# Patient Record
Sex: Female | Born: 1985 | Race: Black or African American | Hispanic: No | Marital: Married | Smoking: Never smoker
Health system: Southern US, Community
[De-identification: ages and names within clinical notes are randomized; demographics above are authoritative.]

## PROBLEM LIST (undated history)

## (undated) DIAGNOSIS — D6861 Antiphospholipid syndrome: Secondary | ICD-10-CM

## (undated) DIAGNOSIS — I10 Essential (primary) hypertension: Secondary | ICD-10-CM

## (undated) DIAGNOSIS — I509 Heart failure, unspecified: Secondary | ICD-10-CM

## (undated) DIAGNOSIS — IMO0002 Reserved for concepts with insufficient information to code with codable children: Secondary | ICD-10-CM

## (undated) DIAGNOSIS — I639 Cerebral infarction, unspecified: Secondary | ICD-10-CM

## (undated) DIAGNOSIS — C8590 Non-Hodgkin lymphoma, unspecified, unspecified site: Secondary | ICD-10-CM

## (undated) DIAGNOSIS — M329 Systemic lupus erythematosus, unspecified: Secondary | ICD-10-CM

## (undated) DIAGNOSIS — I219 Acute myocardial infarction, unspecified: Secondary | ICD-10-CM

## (undated) DIAGNOSIS — D571 Sickle-cell disease without crisis: Secondary | ICD-10-CM

## (undated) DIAGNOSIS — I675 Moyamoya disease: Secondary | ICD-10-CM

## (undated) HISTORY — PX: SPLENECTOMY: SUR1306

## (undated) HISTORY — PX: CHOLECYSTECTOMY: SHX55

---

## 2019-12-11 DIAGNOSIS — S31119A Laceration without foreign body of abdominal wall, unspecified quadrant without penetration into peritoneal cavity, initial encounter: Secondary | ICD-10-CM

## 2019-12-11 HISTORY — DX: Laceration without foreign body of abdominal wall, unspecified quadrant without penetration into peritoneal cavity, initial encounter: S31.119A

## 2019-12-13 ENCOUNTER — Emergency Department (HOSPITAL_COMMUNITY): Payer: Self-pay

## 2019-12-13 ENCOUNTER — Inpatient Hospital Stay (HOSPITAL_COMMUNITY)
Admission: EM | Admit: 2019-12-13 | Discharge: 2019-12-17 | DRG: 812 | Disposition: A | Discharge status: Home / Self Care | Payer: Self-pay | Attending: Internal Medicine | Admitting: Internal Medicine

## 2019-12-13 ENCOUNTER — Other Ambulatory Visit: Payer: Self-pay

## 2019-12-13 ENCOUNTER — Encounter (HOSPITAL_COMMUNITY): Payer: Self-pay | Admitting: Emergency Medicine

## 2019-12-13 DIAGNOSIS — I252 Old myocardial infarction: Secondary | ICD-10-CM

## 2019-12-13 DIAGNOSIS — Z8673 Personal history of transient ischemic attack (TIA), and cerebral infarction without residual deficits: Secondary | ICD-10-CM

## 2019-12-13 DIAGNOSIS — F3130 Bipolar disorder, current episode depressed, mild or moderate severity, unspecified: Secondary | ICD-10-CM | POA: Diagnosis present

## 2019-12-13 DIAGNOSIS — S31119A Laceration without foreign body of abdominal wall, unspecified quadrant without penetration into peritoneal cavity, initial encounter: Secondary | ICD-10-CM

## 2019-12-13 DIAGNOSIS — Z87892 Personal history of anaphylaxis: Secondary | ICD-10-CM

## 2019-12-13 DIAGNOSIS — Z79899 Other long term (current) drug therapy: Secondary | ICD-10-CM

## 2019-12-13 DIAGNOSIS — D571 Sickle-cell disease without crisis: Secondary | ICD-10-CM

## 2019-12-13 DIAGNOSIS — R29898 Other symptoms and signs involving the musculoskeletal system: Secondary | ICD-10-CM

## 2019-12-13 DIAGNOSIS — S31114A Laceration without foreign body of abdominal wall, left lower quadrant without penetration into peritoneal cavity, initial encounter: Secondary | ICD-10-CM | POA: Diagnosis present

## 2019-12-13 DIAGNOSIS — Z86711 Personal history of pulmonary embolism: Secondary | ICD-10-CM

## 2019-12-13 DIAGNOSIS — I675 Moyamoya disease: Secondary | ICD-10-CM | POA: Diagnosis present

## 2019-12-13 DIAGNOSIS — R531 Weakness: Secondary | ICD-10-CM | POA: Diagnosis present

## 2019-12-13 DIAGNOSIS — Z8249 Family history of ischemic heart disease and other diseases of the circulatory system: Secondary | ICD-10-CM

## 2019-12-13 DIAGNOSIS — R079 Chest pain, unspecified: Secondary | ICD-10-CM

## 2019-12-13 DIAGNOSIS — D5700 Hb-SS disease with crisis, unspecified: Principal | ICD-10-CM | POA: Diagnosis present

## 2019-12-13 DIAGNOSIS — Z832 Family history of diseases of the blood and blood-forming organs and certain disorders involving the immune mechanism: Secondary | ICD-10-CM

## 2019-12-13 DIAGNOSIS — Z7982 Long term (current) use of aspirin: Secondary | ICD-10-CM

## 2019-12-13 DIAGNOSIS — I2510 Atherosclerotic heart disease of native coronary artery without angina pectoris: Secondary | ICD-10-CM

## 2019-12-13 DIAGNOSIS — Z7902 Long term (current) use of antithrombotics/antiplatelets: Secondary | ICD-10-CM

## 2019-12-13 DIAGNOSIS — Z20822 Contact with and (suspected) exposure to covid-19: Secondary | ICD-10-CM | POA: Diagnosis present

## 2019-12-13 DIAGNOSIS — Z91041 Radiographic dye allergy status: Secondary | ICD-10-CM

## 2019-12-13 DIAGNOSIS — C8590 Non-Hodgkin lymphoma, unspecified, unspecified site: Secondary | ICD-10-CM | POA: Diagnosis present

## 2019-12-13 DIAGNOSIS — Z7901 Long term (current) use of anticoagulants: Secondary | ICD-10-CM

## 2019-12-13 DIAGNOSIS — M329 Systemic lupus erythematosus, unspecified: Secondary | ICD-10-CM | POA: Diagnosis present

## 2019-12-13 DIAGNOSIS — D6861 Antiphospholipid syndrome: Secondary | ICD-10-CM | POA: Diagnosis present

## 2019-12-13 DIAGNOSIS — S31119D Laceration without foreign body of abdominal wall, unspecified quadrant without penetration into peritoneal cavity, subsequent encounter: Secondary | ICD-10-CM

## 2019-12-13 DIAGNOSIS — I110 Hypertensive heart disease with heart failure: Secondary | ICD-10-CM | POA: Diagnosis present

## 2019-12-13 DIAGNOSIS — G894 Chronic pain syndrome: Secondary | ICD-10-CM | POA: Diagnosis present

## 2019-12-13 DIAGNOSIS — Z955 Presence of coronary angioplasty implant and graft: Secondary | ICD-10-CM

## 2019-12-13 DIAGNOSIS — I509 Heart failure, unspecified: Secondary | ICD-10-CM | POA: Diagnosis present

## 2019-12-13 DIAGNOSIS — T148XXA Other injury of unspecified body region, initial encounter: Secondary | ICD-10-CM

## 2019-12-13 DIAGNOSIS — Z86718 Personal history of other venous thrombosis and embolism: Secondary | ICD-10-CM

## 2019-12-13 DIAGNOSIS — I639 Cerebral infarction, unspecified: Secondary | ICD-10-CM

## 2019-12-13 HISTORY — DX: Cerebral infarction, unspecified: I63.9

## 2019-12-13 HISTORY — DX: Systemic lupus erythematosus, unspecified: M32.9

## 2019-12-13 HISTORY — DX: Antiphospholipid syndrome: D68.61

## 2019-12-13 HISTORY — DX: Sickle-cell disease without crisis: D57.1

## 2019-12-13 HISTORY — DX: Non-Hodgkin lymphoma, unspecified, unspecified site: C85.90

## 2019-12-13 HISTORY — DX: Moyamoya disease: I67.5

## 2019-12-13 HISTORY — DX: Heart failure, unspecified: I50.9

## 2019-12-13 HISTORY — DX: Essential (primary) hypertension: I10

## 2019-12-13 HISTORY — DX: Reserved for concepts with insufficient information to code with codable children: IMO0002

## 2019-12-13 HISTORY — DX: Morbid (severe) obesity due to excess calories: E66.01

## 2019-12-13 HISTORY — DX: Acute myocardial infarction, unspecified: I21.9

## 2019-12-13 LAB — CBC
HCT: 29.5 % — ABNORMAL LOW (ref 36.0–46.0)
Hemoglobin: 8.8 g/dL — ABNORMAL LOW (ref 12.0–15.0)
MCH: 30.6 pg (ref 26.0–34.0)
MCHC: 29.8 g/dL — ABNORMAL LOW (ref 30.0–36.0)
MCV: 102.4 fL — ABNORMAL HIGH (ref 80.0–100.0)
Platelets: 354 10*3/uL (ref 150–400)
RBC: 2.88 MIL/uL — ABNORMAL LOW (ref 3.87–5.11)
RDW: 19.4 % — ABNORMAL HIGH (ref 11.5–15.5)
WBC: 6.9 10*3/uL (ref 4.0–10.5)
nRBC: 0.4 % — ABNORMAL HIGH (ref 0.0–0.2)

## 2019-12-13 LAB — DIFFERENTIAL
Abs Immature Granulocytes: 0.02 10*3/uL (ref 0.00–0.07)
Basophils Absolute: 0.1 10*3/uL (ref 0.0–0.1)
Basophils Relative: 1 %
Eosinophils Absolute: 0.1 10*3/uL (ref 0.0–0.5)
Eosinophils Relative: 1 %
Immature Granulocytes: 0 %
Lymphocytes Relative: 19 %
Lymphs Abs: 1.3 10*3/uL (ref 0.7–4.0)
Monocytes Absolute: 1 10*3/uL (ref 0.1–1.0)
Monocytes Relative: 15 %
Neutro Abs: 4.4 10*3/uL (ref 1.7–7.7)
Neutrophils Relative %: 64 %

## 2019-12-13 LAB — I-STAT CHEM 8, ED
BUN: 9 mg/dL (ref 6–20)
Calcium, Ion: 1.09 mmol/L — ABNORMAL LOW (ref 1.15–1.40)
Chloride: 105 mmol/L (ref 98–111)
Creatinine, Ser: 0.8 mg/dL (ref 0.44–1.00)
Glucose, Bld: 83 mg/dL (ref 70–99)
HCT: 30 % — ABNORMAL LOW (ref 36.0–46.0)
Hemoglobin: 10.2 g/dL — ABNORMAL LOW (ref 12.0–15.0)
Potassium: 3.9 mmol/L (ref 3.5–5.1)
Sodium: 142 mmol/L (ref 135–145)
TCO2: 28 mmol/L (ref 22–32)

## 2019-12-13 LAB — COMPREHENSIVE METABOLIC PANEL
ALT: 12 U/L (ref 0–44)
AST: 17 U/L (ref 15–41)
Albumin: 3.1 g/dL — ABNORMAL LOW (ref 3.5–5.0)
Alkaline Phosphatase: 75 U/L (ref 38–126)
Anion gap: 6 (ref 5–15)
BUN: 10 mg/dL (ref 6–20)
CO2: 25 mmol/L (ref 22–32)
Calcium: 8.1 mg/dL — ABNORMAL LOW (ref 8.9–10.3)
Chloride: 108 mmol/L (ref 98–111)
Creatinine, Ser: 0.81 mg/dL (ref 0.44–1.00)
GFR calc Af Amer: >60 mL/min (ref >60)
GFR calc non Af Amer: >60 mL/min (ref >60)
Glucose, Bld: 87 mg/dL (ref 70–99)
Potassium: 4 mmol/L (ref 3.5–5.1)
Sodium: 139 mmol/L (ref 135–145)
Total Bilirubin: 0.4 mg/dL (ref 0.3–1.2)
Total Protein: 6.1 g/dL — ABNORMAL LOW (ref 6.5–8.1)

## 2019-12-13 LAB — URINALYSIS, COMPLETE (UACMP) WITH MICROSCOPIC
Bilirubin Urine: NEGATIVE
Glucose, UA: NEGATIVE mg/dL
Ketones, ur: NEGATIVE mg/dL
Leukocytes,Ua: NEGATIVE
Nitrite: NEGATIVE
Protein, ur: NEGATIVE mg/dL
Specific Gravity, Urine: 1.008 (ref 1.005–1.030)
pH: 5 (ref 5.0–8.0)

## 2019-12-13 LAB — I-STAT BETA HCG BLOOD, ED (MC, WL, AP ONLY): I-stat hCG, quantitative: <5 m[IU]/mL (ref <5)

## 2019-12-13 LAB — PROTIME-INR
INR: 1.8 — ABNORMAL HIGH (ref 0.8–1.2)
Prothrombin Time: 20.8 seconds — ABNORMAL HIGH (ref 11.4–15.2)

## 2019-12-13 LAB — RETICULOCYTES
Immature Retic Fract: 16.7 % — ABNORMAL HIGH (ref 2.3–15.9)
RBC.: 2.88 MIL/uL — ABNORMAL LOW (ref 3.87–5.11)
Retic Count, Absolute: 18.9 10*3/uL — ABNORMAL LOW (ref 19.0–186.0)
Retic Ct Pct: 3.4 % — ABNORMAL HIGH (ref 0.4–3.1)

## 2019-12-13 LAB — ETHANOL: Alcohol, Ethyl (B): <10 mg/dL (ref <10)

## 2019-12-13 LAB — APTT: aPTT: 47 seconds — ABNORMAL HIGH (ref 24–36)

## 2019-12-13 IMAGING — MR MR MRA HEAD W/O CM
12 of 14 series · 36 of 48 positions shown · non-contrast
Comparison: Head CT [DATE]

CLINICAL DATA: Acute right arm weakness. History of sickle cell
disease.

EXAM:
MRI HEAD WITHOUT CONTRAST
MRA HEAD WITHOUT CONTRAST
TECHNIQUE: Multiplanar, multiecho pulse sequences of the brain and surrounding
structures were obtained without intravenous contrast. Angiographic
images of the head were obtained using MRA technique without
contrast.

[Series 5: DWI · axial · 3.0mm · 0.88mm/px · z∈[-137,-5]mm · 7 of 90 slices shown (1 of 4)]
[im 1/90]
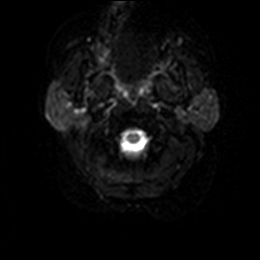
[im 15/90]
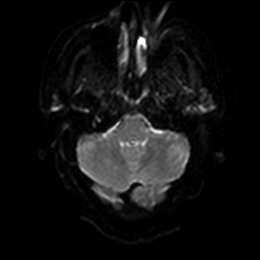
[im 30/90]
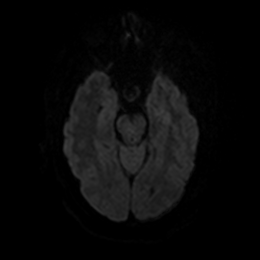
[im 45/90]
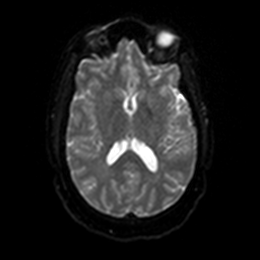
[im 60/90]
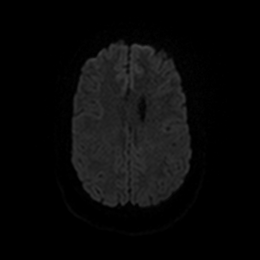
[im 75/90]
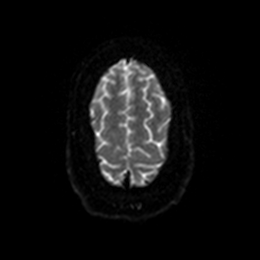
[im 90/90]
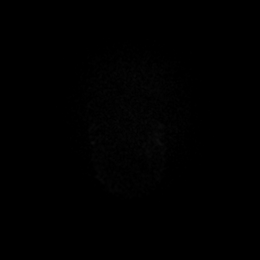

[Series 6: DWI · axial · 3.0mm · 0.88mm/px · z∈[-137,-5]mm · 3 of 45 slices shown (2 of 4)]
[im 1/45]
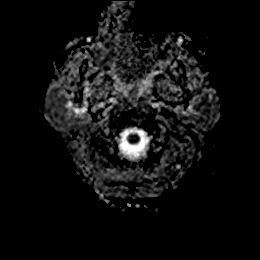
[im 23/45]
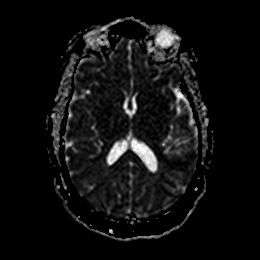
[im 45/45]
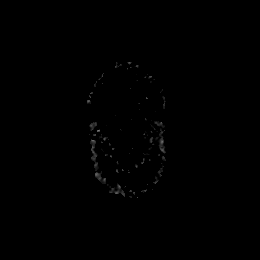

[Series 7: DWI · coronal · 4.0mm · 0.88mm/px · 4 of 64 slices shown (3 of 4)]
[im 1/64]
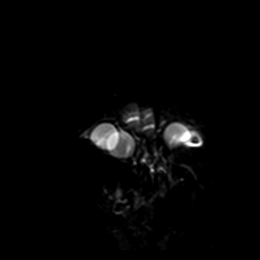
[im 22/64]
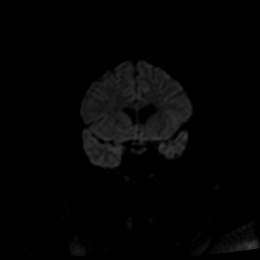
[im 43/64]
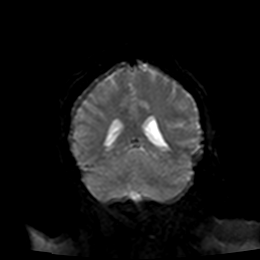
[im 64/64]
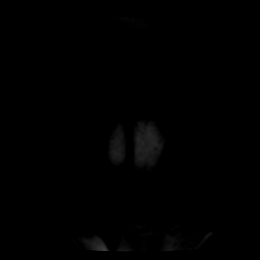

[Series 8: DWI · coronal · 4.0mm · 0.88mm/px · 2 of 32 slices shown (4 of 4)]
[im 1/32]
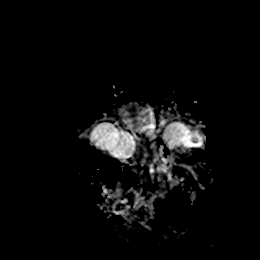
[im 32/32]
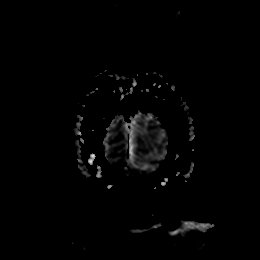

[Series 13: T1 · sagittal · 5.0mm · 0.75mm/px · 2 of 23 slices shown]
[im 1/23]
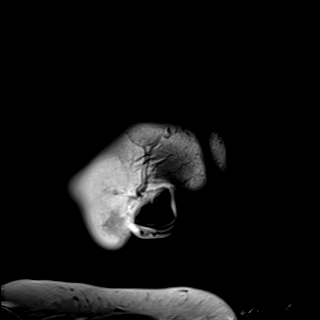
[im 23/23]
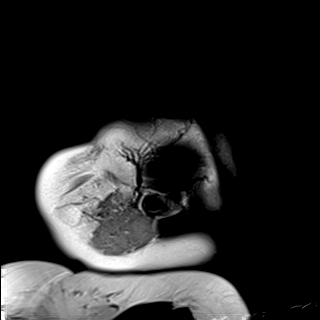

[Series 14: T2 · axial · 5.0mm · 0.72mm/px · z∈[-152,-9]mm · 2 of 25 slices shown (1 of 2)]
[im 1/25]
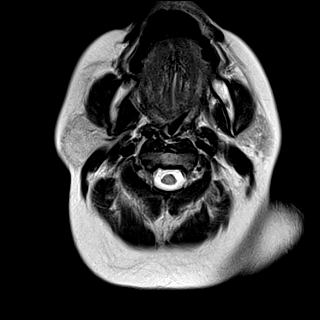
[im 25/25]
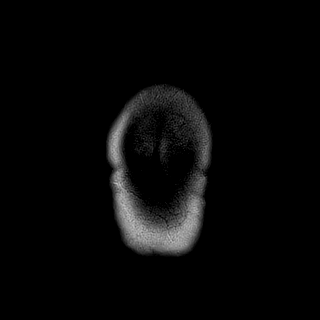

[Series 15: FLAIR · axial · 5.0mm · 0.45mm/px · z∈[-152,-9]mm · 2 of 25 slices shown]
[im 1/25]
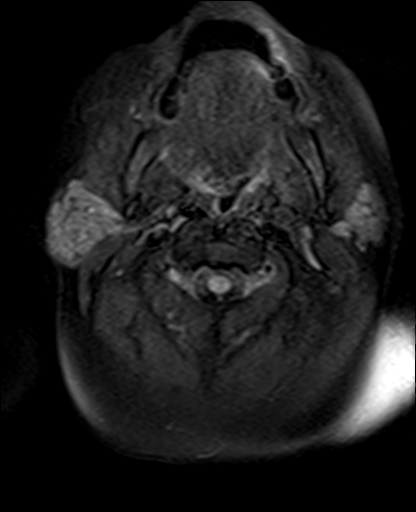
[im 25/25]
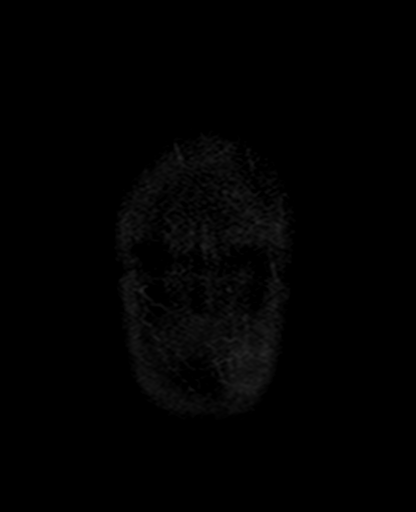

[Series 16: mag_images · axial · 3.0mm · 0.90mm/px · z∈[-159,-6]mm · 3 of 52 slices shown]
[im 1/52]
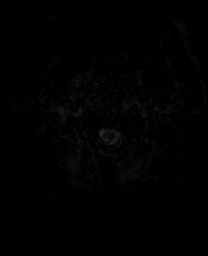
[im 26/52]
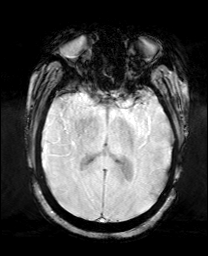
[im 52/52]
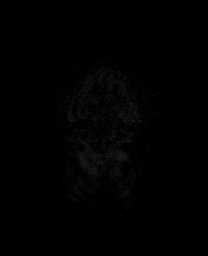

[Series 17: pha_images · axial · 3.0mm · 0.90mm/px · z∈[-159,-6]mm · 3 of 52 slices shown]
[im 1/52]
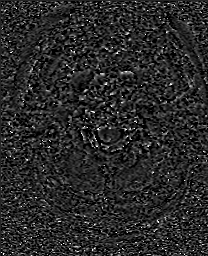
[im 26/52]
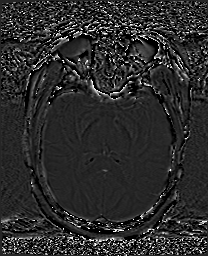
[im 52/52]
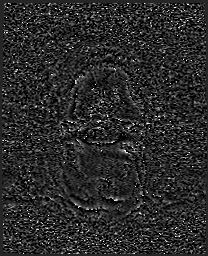

[Series 18: swi_images · axial · 3.0mm · 0.90mm/px · z∈[-159,-6]mm · 3 of 52 slices shown]
[im 1/52]
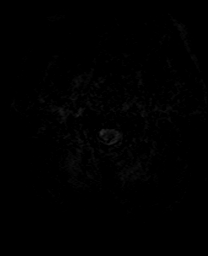
[im 26/52]
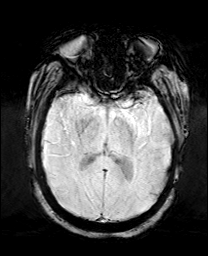
[im 52/52]
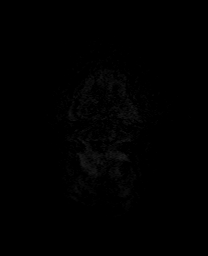

[Series 19: mip_images(sw) · axial · 24.0mm · 0.90mm/px · z∈[-148,-17]mm · 3 of 45 slices shown]
[im 1/45]
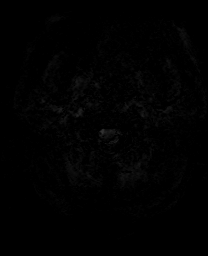
[im 23/45]
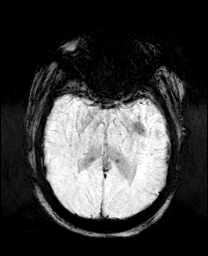
[im 45/45]
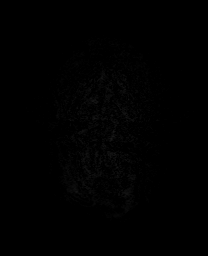

[Series 21: T2 · coronal · 5.0mm · 0.34mm/px · 2 of 29 slices shown (2 of 2)]
[im 1/29]
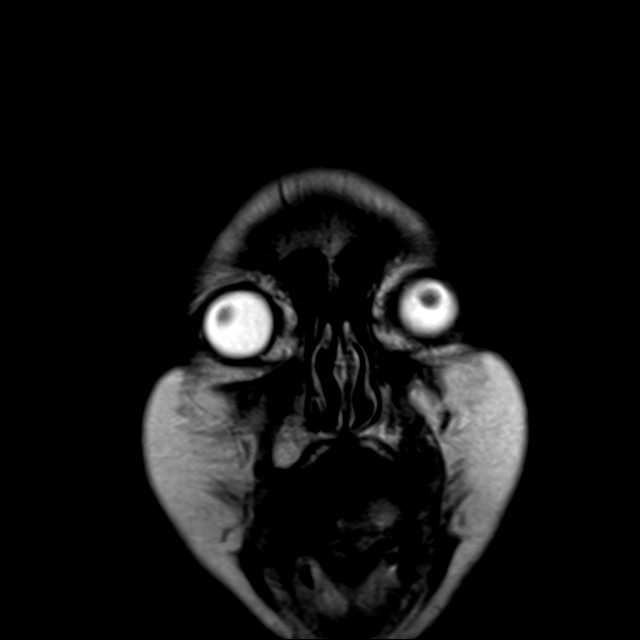
[im 29/29]
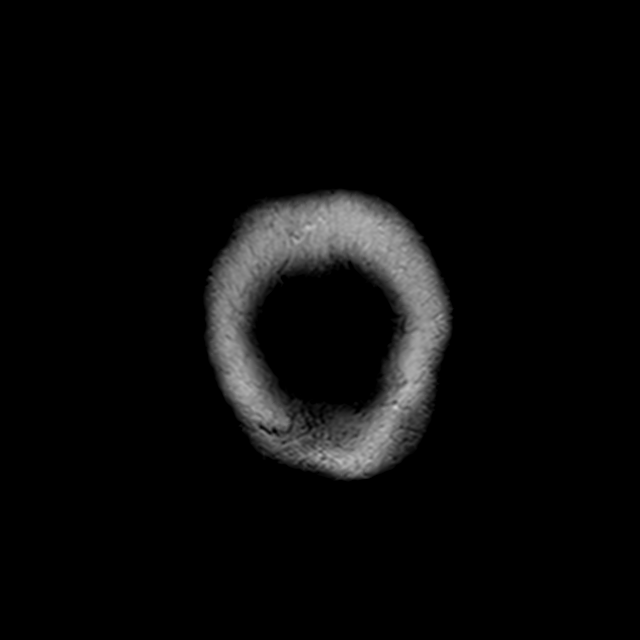

[36 of 48 positions shown; findings below may reference images not displayed]

FINDINGS: MRI HEAD FINDINGS

Some sequences are moderately motion degraded.

Brain: There is no evidence of acute infarct, intracranial
hemorrhage, mass, midline shift, or extra-axial fluid collection.
The ventricles and sulci are normal. The brain is normal in signal.

Vascular: Major intracranial vascular flow voids are preserved.

Skull and upper cervical spine: Unremarkable bone marrow signal.

Sinuses/Orbits: Unremarkable orbits. Paranasal sinuses and mastoid
air cells are clear.

Other: None.

MRA HEAD FINDINGS

The study is mildly to moderately motion degraded.

The visualized distal vertebral arteries are patent to the basilar
and codominant. Patent AICAs and SCAs are seen bilaterally. There
are moderately large posterior communicating arteries bilaterally.
Both PCAs are patent without evidence of significant proximal
stenosis.

The internal carotid arteries are widely patent from skull base to
carotid termini. ACAs and MCAs are patent without evidence of
proximal branch occlusion or significant proximal stenosis. No
aneurysm is identified.
IMPRESSION: 1. Negative head MRI.
2. Motion degraded head MRA without evidence of major intracranial
arterial occlusion or significant proximal stenosis.

## 2019-12-13 IMAGING — MR MR CERVICAL SPINE W/O CM
5 series · 42 of 48 positions shown · non-contrast
Comparison: None.

CLINICAL DATA: Right arm weakness

EXAM:
MRI CERVICAL SPINE WITHOUT CONTRAST
TECHNIQUE: Multiplanar, multisequence MR imaging of the cervical spine was
performed. No intravenous contrast was administered.

[Series 5: T1 · sagittal · 3.0mm · 0.78mm/px · 6 of 15 slices shown]
[im 1/15]
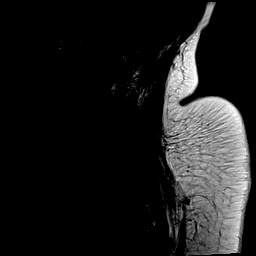
[im 3/15]
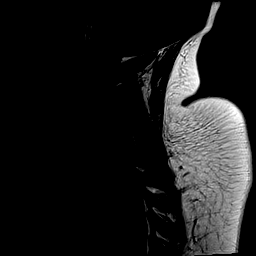
[im 6/15]
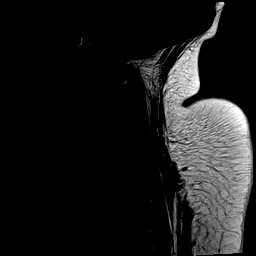
[im 9/15]
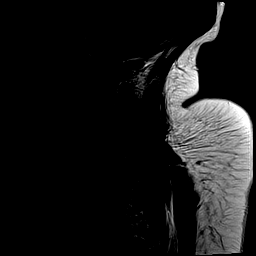
[im 12/15]
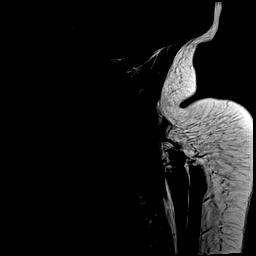
[im 15/15]
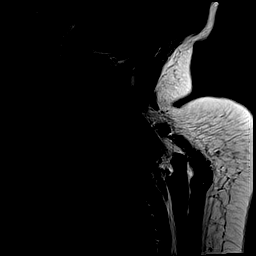

[Series 6: T2 · sagittal · 3.0mm · 0.66mm/px · 7 of 15 slices shown (1 of 2)]
[im 1/15]
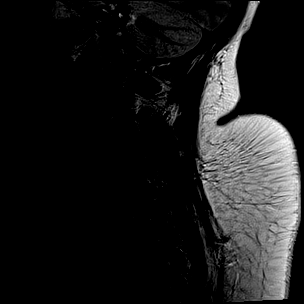
[im 3/15]
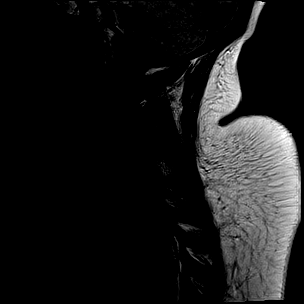
[im 5/15]
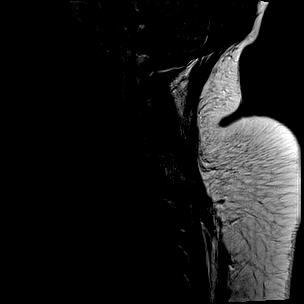
[im 8/15]
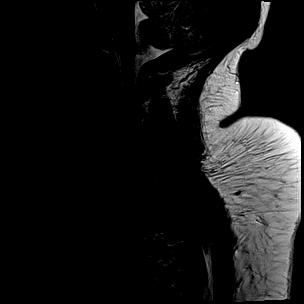
[im 10/15]
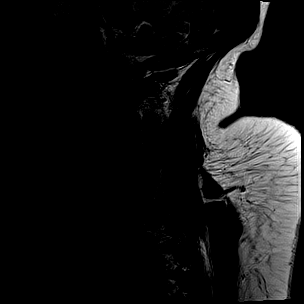
[im 12/15]
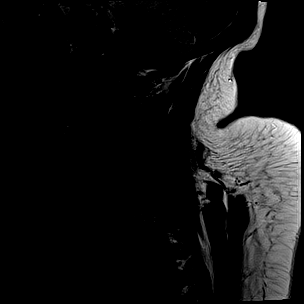
[im 15/15]
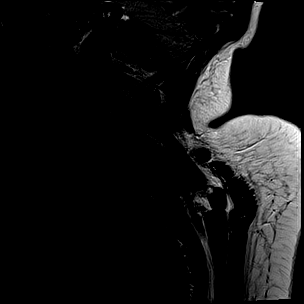

[Series 7: STIR · sagittal · 3.0mm · 0.62mm/px · 7 of 15 slices shown]
[im 1/15]
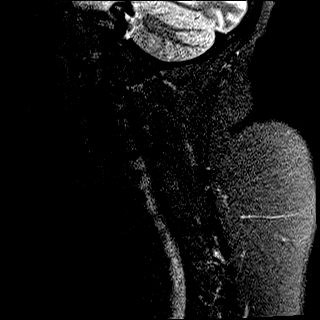
[im 3/15]
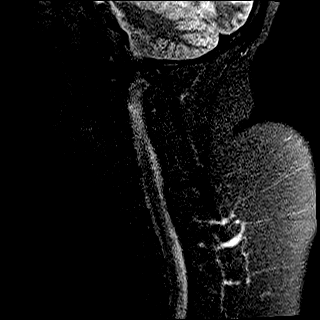
[im 5/15]
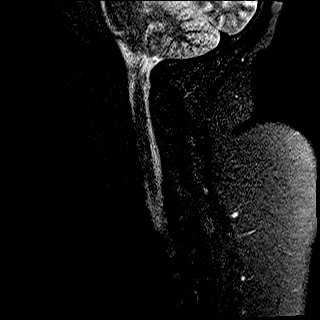
[im 8/15]
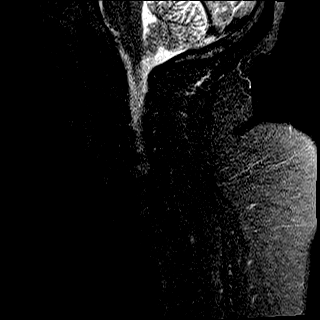
[im 10/15]
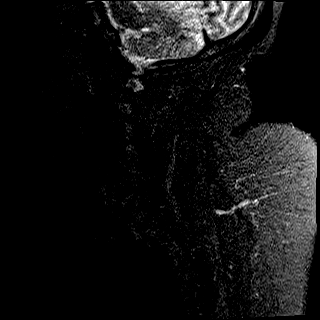
[im 12/15]
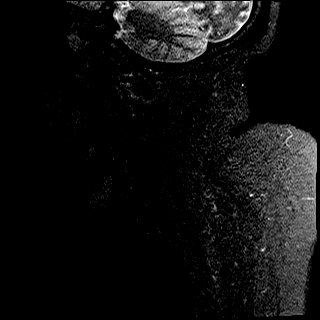
[im 15/15]
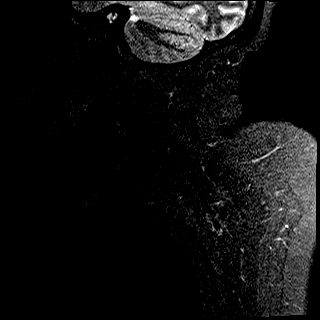

[Series 8: GRE · axial · 3.0mm · 0.39mm/px · z∈[-176,-88]mm · 8 of 30 slices shown]
[im 1/30]
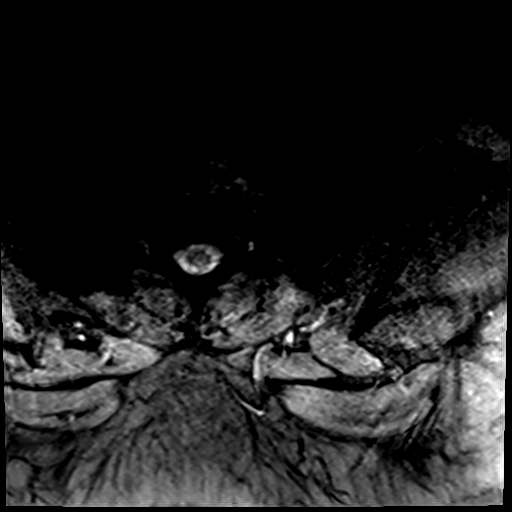
[im 5/30]
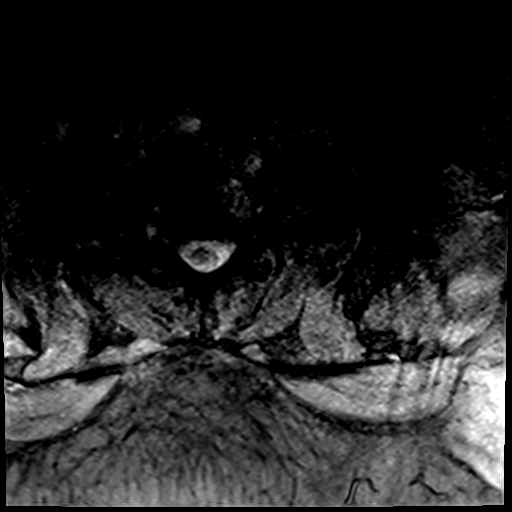
[im 9/30]
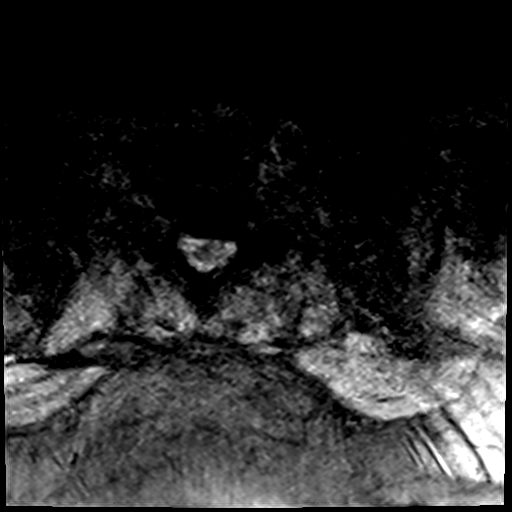
[im 14/30]
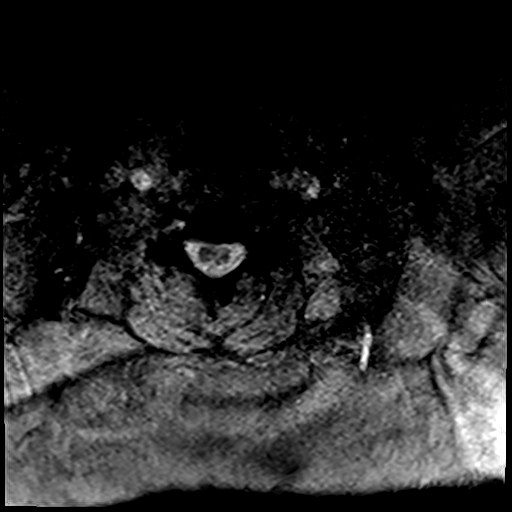
[im 16/30]
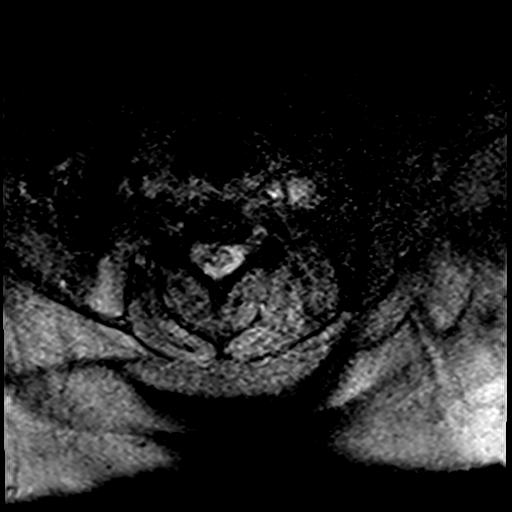
[im 21/30]
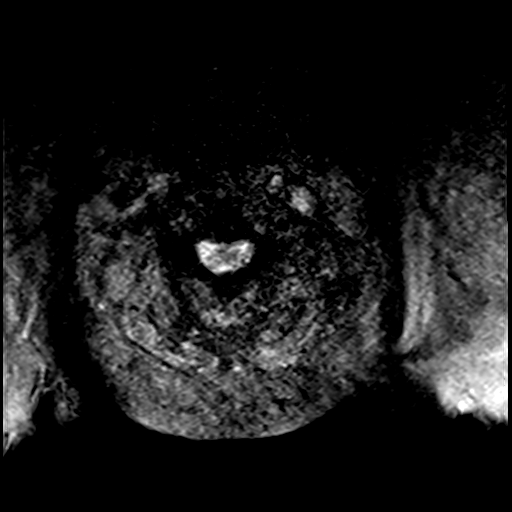
[im 25/30]
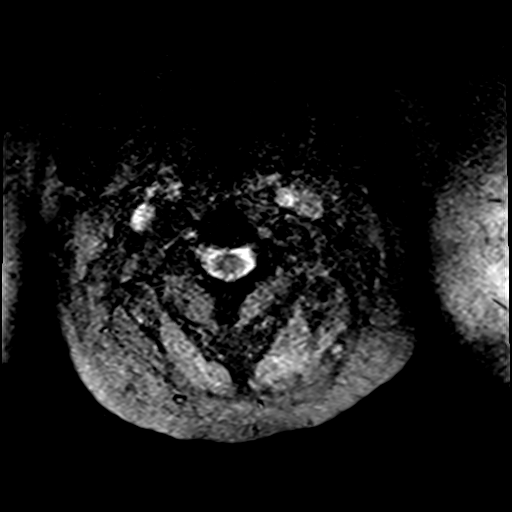
[im 30/30]
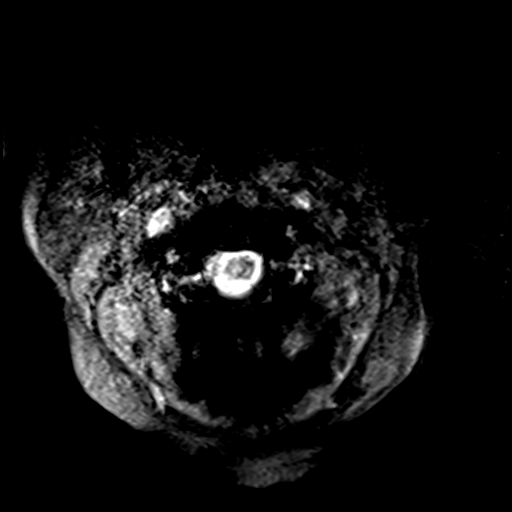

[Series 9: T2 · axial · 3.0mm · 0.70mm/px · z∈[-172,-84]mm · 14 of 30 slices shown (2 of 2)]
[im 1/30]
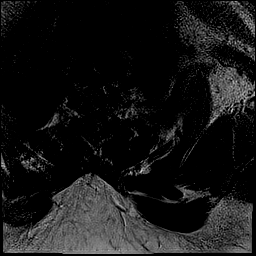
[im 3/30]
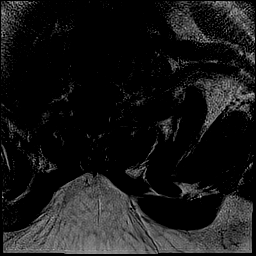
[im 5/30]
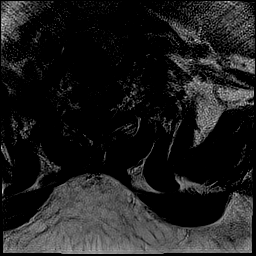
[im 7/30]
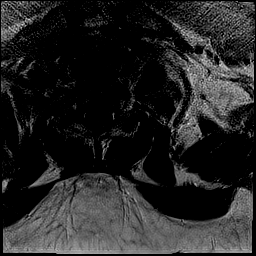
[im 9/30]
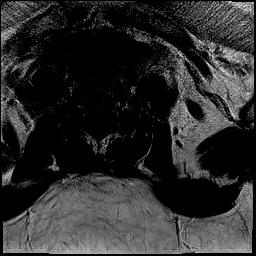
[im 12/30]
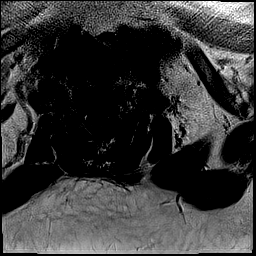
[im 14/30]
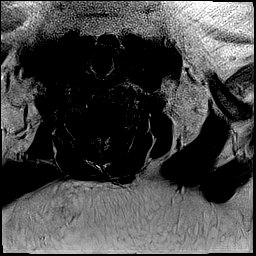
[im 16/30]
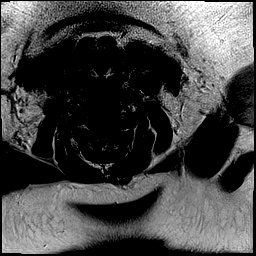
[im 18/30]
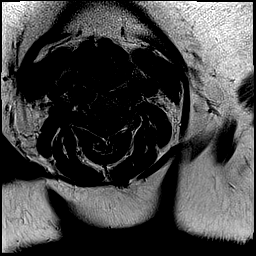
[im 21/30]
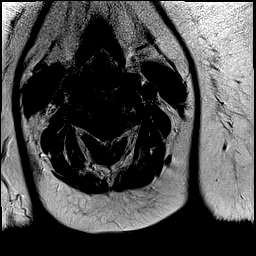
[im 23/30]
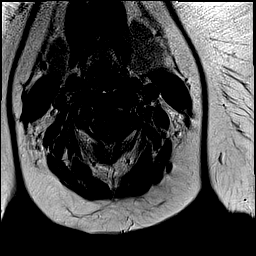
[im 25/30]
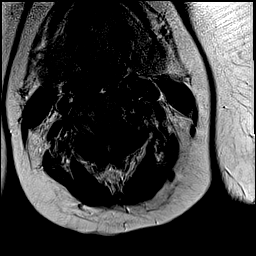
[im 27/30]
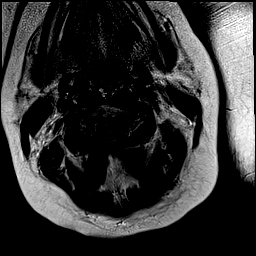
[im 30/30]
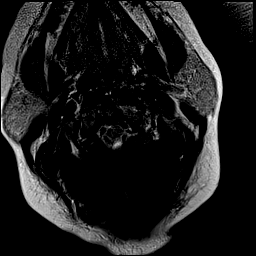

[42 of 48 positions shown; findings below may reference images not displayed]

FINDINGS: Decreased signal due to body habitus. Motion artifact. Sagittal
imaging does not extend through the entire right aspect of the
vertebral bodies.

Alignment: No significant listhesis.

Vertebrae: Cervical vertebral body heights are maintained. Loss of
height at upper thoracic levels likely related to reported sickle
cell disease. There is no marrow edema or suspicious osseous lesion.

Cord: No abnormal signal within the above limitation.

Posterior Fossa, vertebral arteries, paraspinal tissues:
Unremarkable.

Disc levels: There is no disc herniation or significant stenosis at
any cervical level.
IMPRESSION: No significant abnormality identified.
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
## 2019-12-13 IMAGING — CT CT HEAD CODE STROKE
4 series · 17 of 47 positions shown, 19 images · non-contrast
Comparison: None.

CLINICAL DATA: Code stroke. Focal neuro deficit greater than 6
hours

EXAM:
CT HEAD WITHOUT CONTRAST
TECHNIQUE: Contiguous axial images were obtained from the base of the skull
through the vertex without intravenous contrast.

[Series 3: head wo · axial · 0.42mm/px · z∈[-158,-38]mm · 7 of 34 slices shown, 9 images]
[im 5/34  brain]
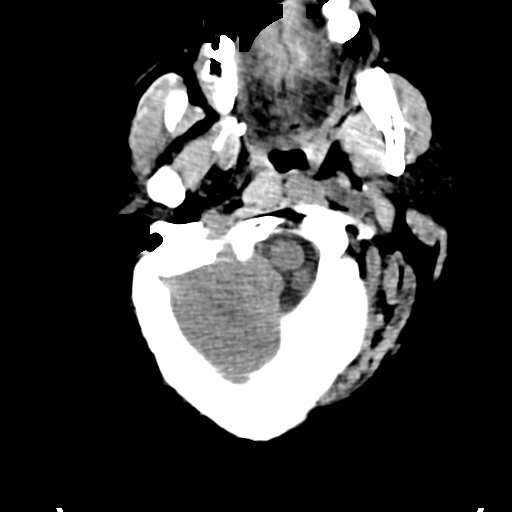
[im 5/34  bone]
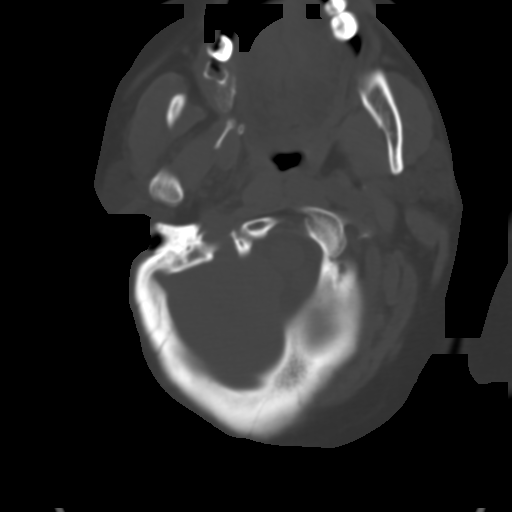
[im 9/34  brain]
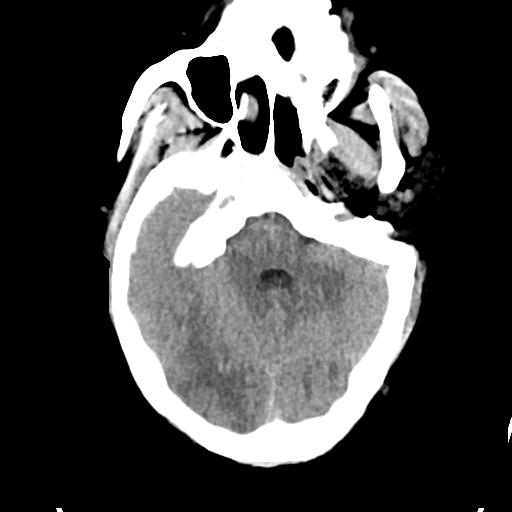
[im 13/34  brain]
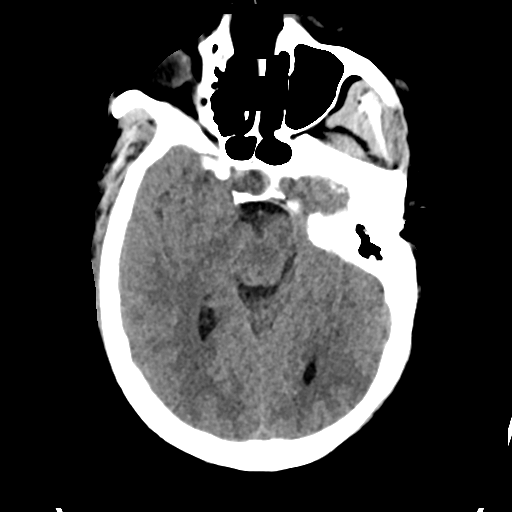
[im 17/34  brain]
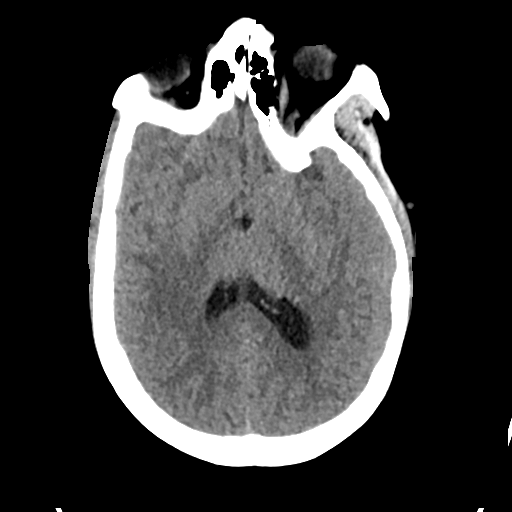
[im 21/34  brain]
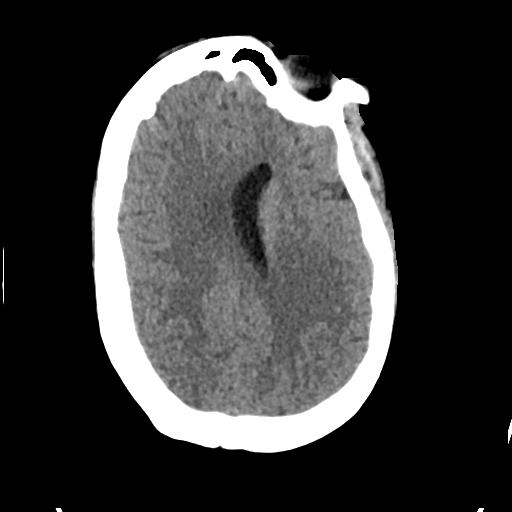
[im 21/34  bone]
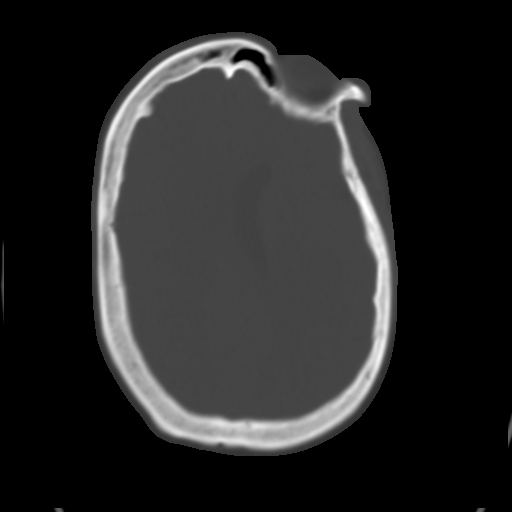
[im 25/34  brain]
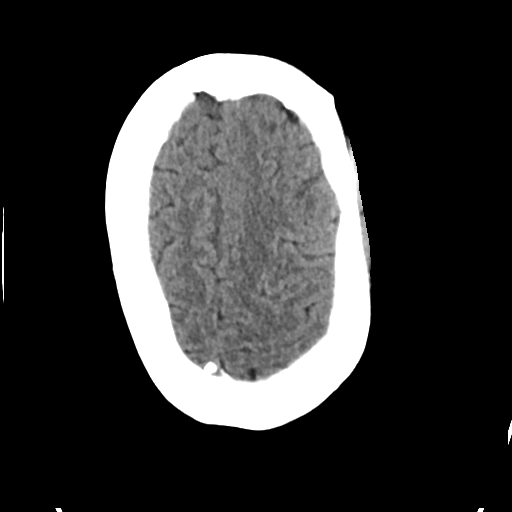
[im 29/34  brain]
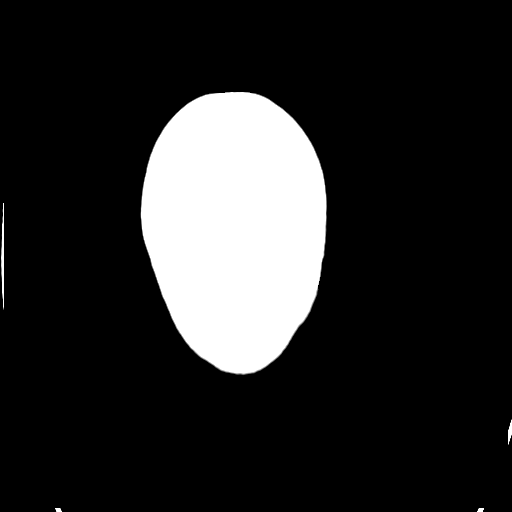

[Series 4: head bone · axial · 0.42mm/px · z∈[-162,-104]mm · 4 of 84 slices shown]
[im 9/84  bone]
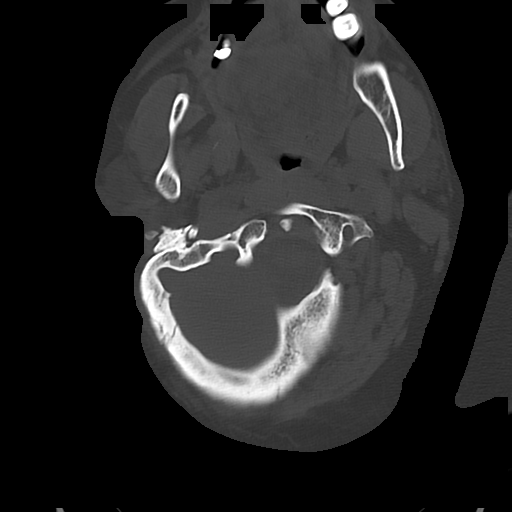
[im 17/84  bone]
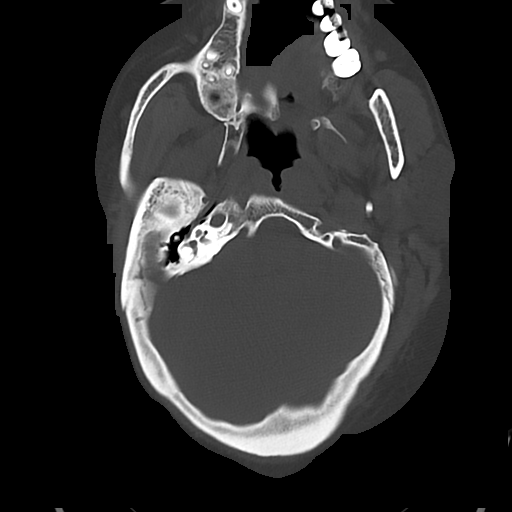
[im 25/84  bone]
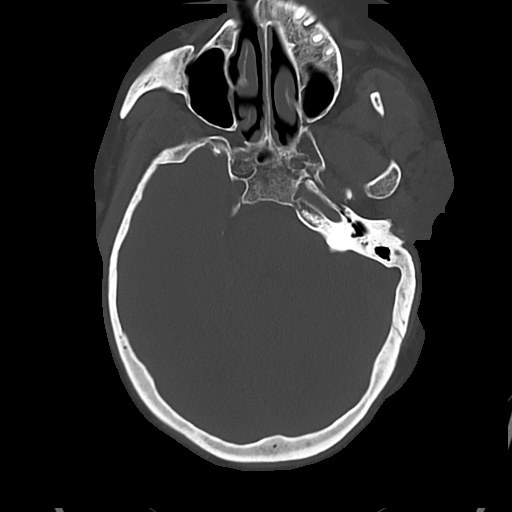
[im 38/84  bone]
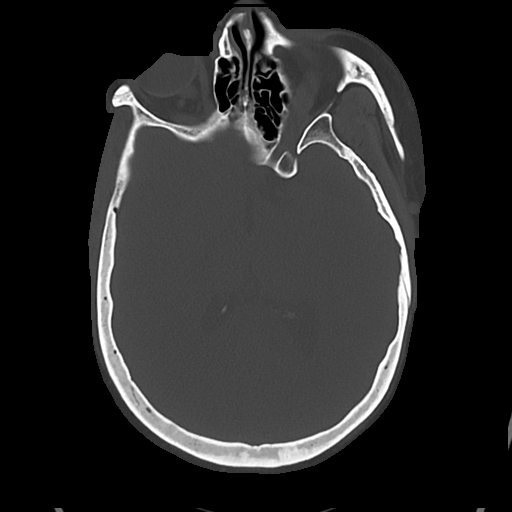

[Series 5: cor soft · coronal · 0.33mm/px · 3 of 73 slices shown]
[im 25/73  brain]
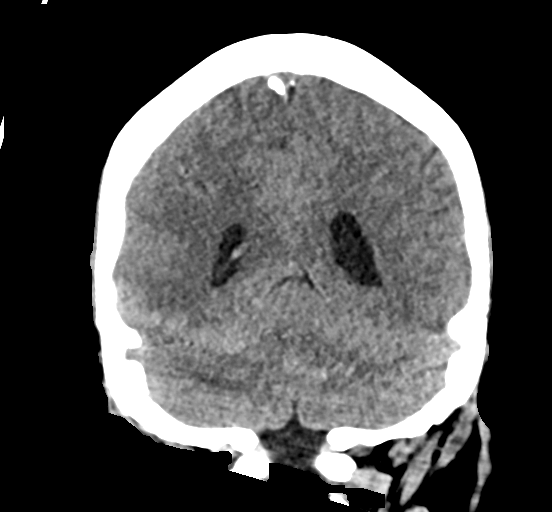
[im 33/73  brain]
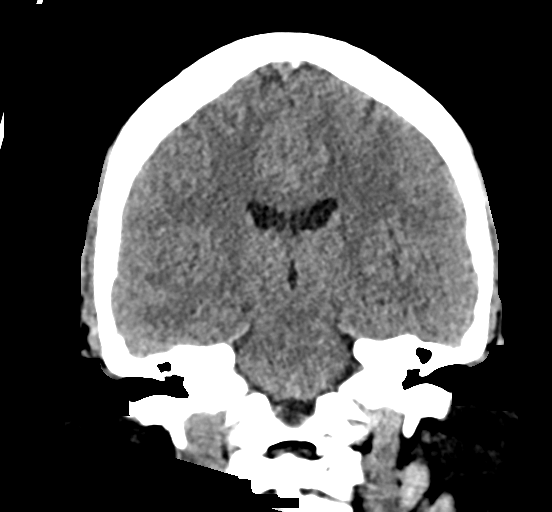
[im 41/73  brain]
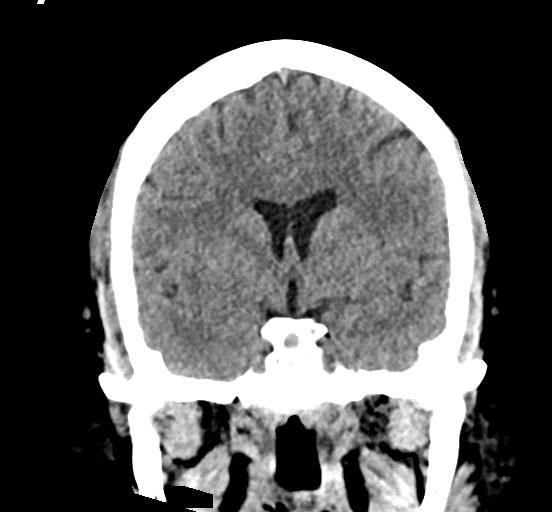

[Series 6: sag soft · sagittal · 0.34mm/px · 3 of 60 slices shown]
[im 26/60  brain]
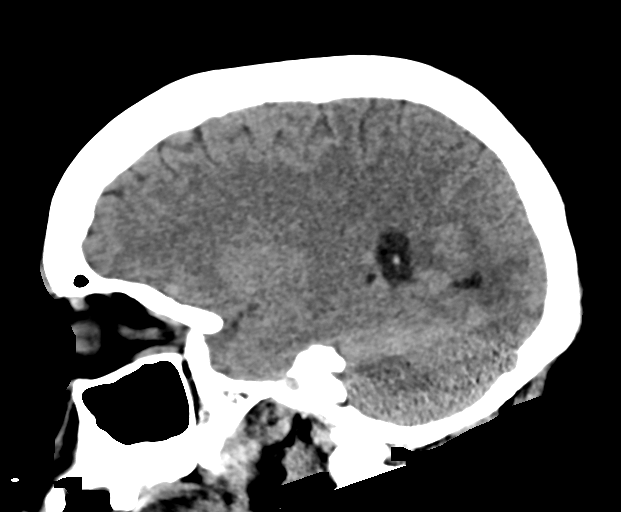
[im 30/60  brain]
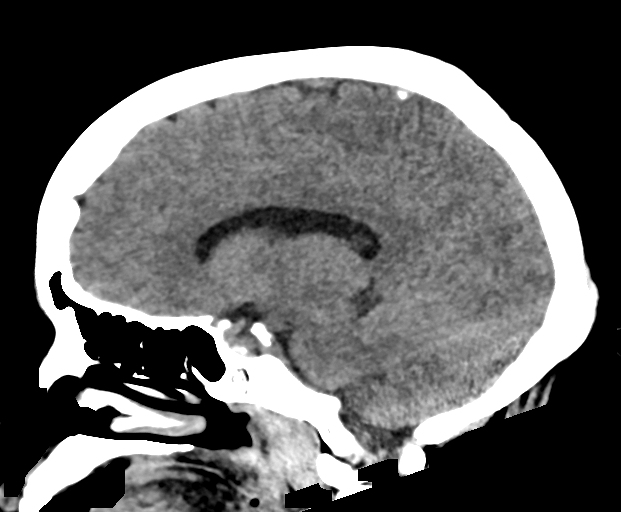
[im 34/60  brain]
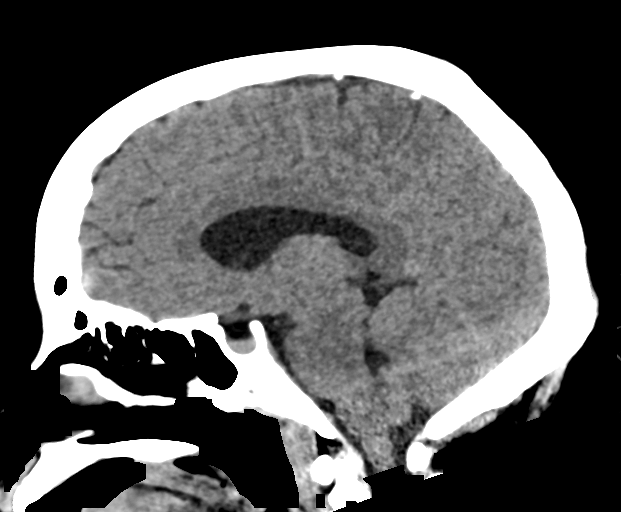

[17 of 47 positions shown; findings below may reference images not displayed]

FINDINGS: Brain: No evidence of acute infarction, hemorrhage, hydrocephalus,
extra-axial collection or mass lesion/mass effect.

Vascular: Negative for hyperdense vessel

Skull: Negative

Sinuses/Orbits: Negative

Other: None

ASPECTS (Alberta Stroke Program Early CT Score)

- Ganglionic level infarction (caudate, lentiform nuclei, internal
capsule, insula, M1-M3 cortex): 7

- Supraganglionic infarction (M4-M6 cortex): 3

Total score (0-10 with 10 being normal): 10
IMPRESSION: 1. Normal CT head
2. ASPECTS is 10
3. These results were called by telephone at the time of
interpretation on [DATE] at [DATE] to provider LAMOTHE, who
verbally acknowledged these results.
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
## 2019-12-13 IMAGING — DX DG CHEST 1V PORT
1 series · 1 of 1 positions shown · non-contrast
Comparison: None.

CLINICAL DATA: Port placement.

EXAM:
PORTABLE CHEST 1 VIEW

[chest ap]
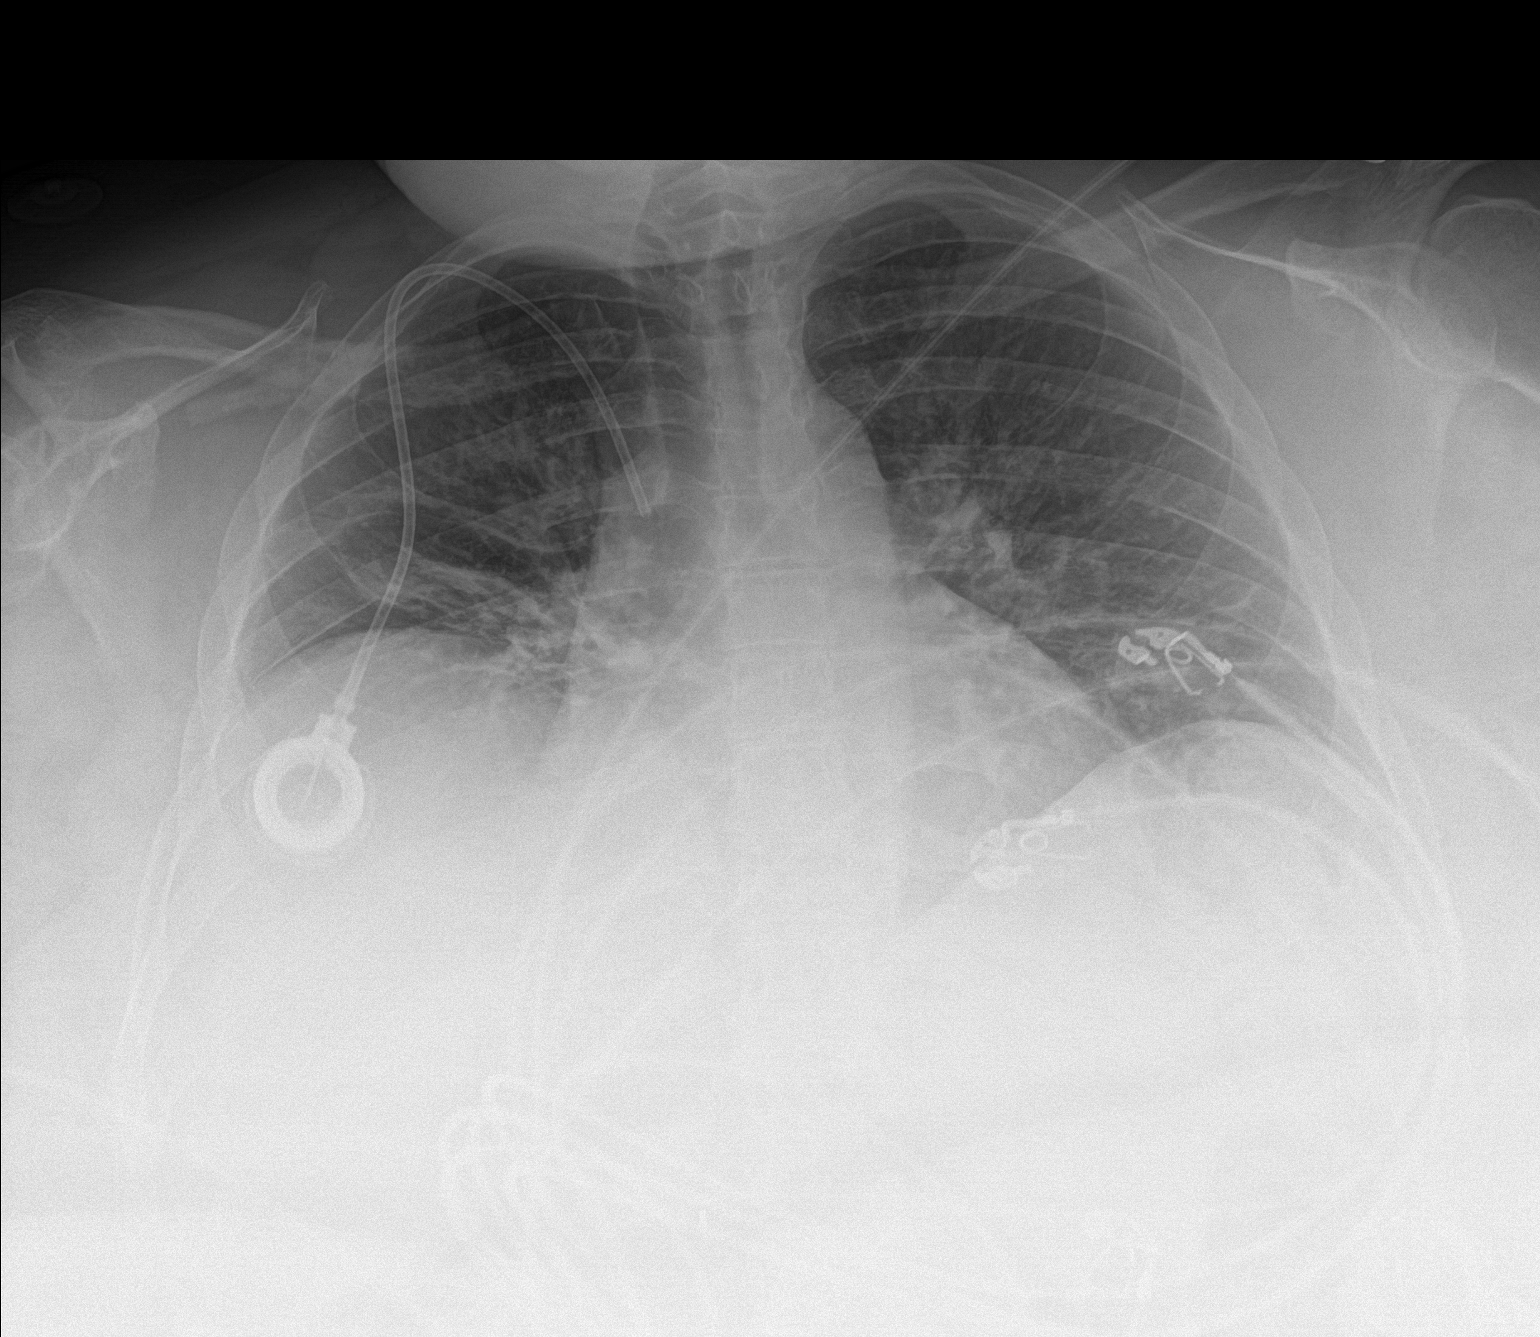

[1 of 1 positions shown; findings below may reference images not displayed]

FINDINGS: [1W] hours. Right IJ Port-A-Cath extends to the level of the mid
SVC. There are low lung volumes with right greater than left
subsegmental atelectasis. No pneumothorax or significant pleural
effusion. The heart size and mediastinal contours are normal.
Telemetry leads overlie the chest.
IMPRESSION: Port-A-Cath placement as described. Bibasilar atelectasis. No
evidence of pneumothorax.
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
## 2019-12-13 MED ORDER — HYDROMORPHONE HCL 1 MG/ML IJ SOLN
2.0000 mg | Freq: Once | INTRAMUSCULAR | Status: AC
  Administered 2019-12-13: 2 mg via INTRAVENOUS
  Filled 2019-12-13: qty 2

## 2019-12-13 MED ORDER — KETOROLAC TROMETHAMINE 30 MG/ML IJ SOLN
15.0000 mg | Freq: Four times a day (QID) | INTRAMUSCULAR | Status: DC
  Administered 2019-12-14 – 2019-12-16 (×9): 15 mg via INTRAVENOUS
  Filled 2019-12-13 (×10): qty 1

## 2019-12-13 MED ORDER — HYDROCORTISONE NA SUCCINATE PF 250 MG IJ SOLR
200.0000 mg | Freq: Once | INTRAMUSCULAR | Status: AC
  Administered 2019-12-13: 200 mg via INTRAVENOUS
  Filled 2019-12-13: qty 200

## 2019-12-13 MED ORDER — SODIUM CHLORIDE 0.45 % IV SOLN: INTRAVENOUS | Status: DC

## 2019-12-13 MED ORDER — CARVEDILOL 3.125 MG PO TABS
3.1250 mg | ORAL_TABLET | Freq: Two times a day (BID) | ORAL | Status: DC
  Administered 2019-12-14 – 2019-12-17 (×4): 3.125 mg via ORAL
  Filled 2019-12-13 (×5): qty 1

## 2019-12-13 MED ORDER — HYDROMORPHONE 1 MG/ML IV SOLN
INTRAVENOUS | Status: DC
  Administered 2019-12-13: 30 mg via INTRAVENOUS
  Administered 2019-12-14: 6.5 mg via INTRAVENOUS
  Administered 2019-12-14: 2 mg via INTRAVENOUS
  Filled 2019-12-13: qty 30

## 2019-12-13 MED ORDER — HYDROXYUREA 500 MG PO CAPS: 500.0000 mg | ORAL_CAPSULE | ORAL | Status: DC

## 2019-12-13 MED ORDER — DIPHENHYDRAMINE HCL 25 MG PO CAPS: 50.0000 mg | ORAL_CAPSULE | Freq: Once | ORAL | Status: AC

## 2019-12-13 MED ORDER — HYDROMORPHONE HCL 2 MG PO TABS: 2.0000 mg | ORAL_TABLET | Freq: Once | ORAL | Status: DC

## 2019-12-13 MED ORDER — KETOROLAC TROMETHAMINE 30 MG/ML IJ SOLN: 30.0000 mg | Freq: Four times a day (QID) | INTRAMUSCULAR | Status: DC

## 2019-12-13 MED ORDER — DIPHENHYDRAMINE HCL 12.5 MG/5ML PO ELIX: 12.5000 mg | ORAL_SOLUTION | Freq: Four times a day (QID) | ORAL | Status: DC | PRN

## 2019-12-13 MED ORDER — POLYETHYLENE GLYCOL 3350 17 G PO PACK: 17.0000 g | PACK | Freq: Every day | ORAL | Status: DC | PRN

## 2019-12-13 MED ORDER — DIPHENHYDRAMINE HCL 50 MG/ML IJ SOLN
50.0000 mg | Freq: Once | INTRAMUSCULAR | Status: AC
  Administered 2019-12-13: 50 mg via INTRAVENOUS
  Filled 2019-12-13: qty 1

## 2019-12-13 MED ORDER — SODIUM CHLORIDE 0.9% FLUSH: 9.0000 mL | INTRAVENOUS | Status: DC | PRN

## 2019-12-13 MED ORDER — SENNOSIDES-DOCUSATE SODIUM 8.6-50 MG PO TABS
1.0000 | ORAL_TABLET | Freq: Two times a day (BID) | ORAL | Status: DC
  Administered 2019-12-14 – 2019-12-16 (×5): 1 via ORAL
  Filled 2019-12-13 (×8): qty 1

## 2019-12-13 MED ORDER — FOLIC ACID 1 MG PO TABS
1.0000 mg | ORAL_TABLET | Freq: Every day | ORAL | Status: DC
  Administered 2019-12-14 – 2019-12-16 (×3): 1 mg via ORAL
  Filled 2019-12-13 (×4): qty 1

## 2019-12-13 MED ORDER — HYDROMORPHONE HCL 1 MG/ML IJ SOLN
3.0000 mg | Freq: Once | INTRAMUSCULAR | Status: AC
  Administered 2019-12-13: 3 mg via INTRAVENOUS
  Filled 2019-12-13: qty 3

## 2019-12-13 MED ORDER — TETANUS-DIPHTH-ACELL PERTUSSIS 5-2.5-18.5 LF-MCG/0.5 IM SUSP
0.5000 mL | Freq: Once | INTRAMUSCULAR | Status: DC
  Filled 2019-12-13: qty 0.5

## 2019-12-13 MED ORDER — ONDANSETRON HCL 4 MG/2ML IJ SOLN: 4.0000 mg | Freq: Four times a day (QID) | INTRAMUSCULAR | Status: DC | PRN

## 2019-12-13 MED ORDER — DULOXETINE HCL 30 MG PO CPEP
60.0000 mg | ORAL_CAPSULE | Freq: Two times a day (BID) | ORAL | Status: DC
  Administered 2019-12-13 – 2019-12-16 (×7): 60 mg via ORAL
  Filled 2019-12-13 (×5): qty 2
  Filled 2019-12-13: qty 1
  Filled 2019-12-13: qty 2
  Filled 2019-12-13: qty 1
  Filled 2019-12-13 (×2): qty 2

## 2019-12-13 MED ORDER — DIPHENHYDRAMINE HCL 25 MG PO CAPS
25.0000 mg | ORAL_CAPSULE | Freq: Once | ORAL | Status: AC
  Administered 2019-12-13: 25 mg via ORAL
  Filled 2019-12-13: qty 1

## 2019-12-13 MED ORDER — NALOXONE HCL 0.4 MG/ML IJ SOLN: 0.4000 mg | INTRAMUSCULAR | Status: DC | PRN

## 2019-12-13 MED ORDER — DIPHENHYDRAMINE HCL 50 MG/ML IJ SOLN
12.5000 mg | Freq: Four times a day (QID) | INTRAMUSCULAR | Status: DC | PRN
  Administered 2019-12-13: 12.5 mg via INTRAVENOUS
  Filled 2019-12-13: qty 1

## 2019-12-13 MED ORDER — ZIPRASIDONE HCL 40 MG PO CAPS
80.0000 mg | ORAL_CAPSULE | Freq: Two times a day (BID) | ORAL | Status: DC
  Administered 2019-12-14 – 2019-12-17 (×4): 80 mg via ORAL
  Filled 2019-12-13 (×8): qty 2

## 2019-12-13 MED ORDER — ZIPRASIDONE HCL 80 MG PO CAPS
80.0000 mg | ORAL_CAPSULE | Freq: Two times a day (BID) | ORAL | Status: DC
  Filled 2019-12-13: qty 1

## 2019-12-13 MED ORDER — SODIUM CHLORIDE 0.9 % IV SOLN
25.0000 mg | Freq: Once | INTRAVENOUS | Status: DC
  Filled 2019-12-13: qty 0.5

## 2019-12-13 MED ORDER — DIPHENHYDRAMINE HCL 50 MG/ML IJ SOLN: 25.0000 mg | Freq: Once | INTRAMUSCULAR | Status: DC

## 2019-12-13 MED ORDER — SODIUM CHLORIDE 0.9 % IV SOLN
25.0000 mg | Freq: Once | INTRAVENOUS | Status: AC
  Administered 2019-12-13: 25 mg via INTRAVENOUS
  Filled 2019-12-13: qty 0.5

## 2019-12-13 NOTE — ED Notes (Addendum)
Pt refuses PIV and phlebotomy blood draw, would only like port accessed.
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
## 2019-12-13 NOTE — ED Notes (Signed)
Tele   Called [facility] for transport to [facility]
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
## 2019-12-13 NOTE — ED Notes (Signed)
Pt requesting more pain medication prior to MRI. Pt informed that more pain medication would not be administered at this time. Pt ok with going to MRI.
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
## 2019-12-13 NOTE — ED Notes (Signed)
[facility] is present to pick up pateint. Report has been called to [facility] by Edison Nasuti, RN.
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
## 2019-12-13 NOTE — Code Documentation (Signed)
MD reviewed MRI. No acute treatment ordered for this patient. q4 hour mNIHSS ordered.

## 2019-12-13 NOTE — ED Notes (Signed)
Pt wanting update from MD concerning scans and pain control. Dr Delane Ginger to talk to pt and pt advised not to leave AMA until MD speaks with her as there are additional concerns from neurology.
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
## 2019-12-13 NOTE — ED Notes (Signed)
RN to RN report given to [facility] to [place] with no question or concerns.

## 2019-12-13 NOTE — ED Notes (Signed)
Pt ambulating around room independently. Stating she feels like the weakness in her right arm is resolving. Alert and oriented

## 2019-12-13 NOTE — ED Notes (Signed)
Pt given sandwich and crackers. Tolerated well. ED attending at bedside.

## 2019-12-14 ENCOUNTER — Observation Stay (HOSPITAL_COMMUNITY): Payer: Self-pay

## 2019-12-14 DIAGNOSIS — T148XXA Other injury of unspecified body region, initial encounter: Secondary | ICD-10-CM

## 2019-12-14 DIAGNOSIS — D5700 Hb-SS disease with crisis, unspecified: Principal | ICD-10-CM

## 2019-12-14 DIAGNOSIS — R29898 Other symptoms and signs involving the musculoskeletal system: Secondary | ICD-10-CM

## 2019-12-14 DIAGNOSIS — Z86718 Personal history of other venous thrombosis and embolism: Secondary | ICD-10-CM

## 2019-12-14 LAB — SARS CORONAVIRUS 2 (TAT 6-24 HRS): SARS Coronavirus 2: NEGATIVE

## 2019-12-14 LAB — RAPID URINE DRUG SCREEN, HOSP PERFORMED
Amphetamines: NOT DETECTED
Barbiturates: NOT DETECTED
Benzodiazepines: NOT DETECTED
Cocaine: NOT DETECTED
Opiates: POSITIVE — AB
Tetrahydrocannabinol: NOT DETECTED

## 2019-12-14 MED ORDER — SODIUM CHLORIDE 0.9% FLUSH
10.0000 mL | Freq: Two times a day (BID) | INTRAVENOUS | Status: DC
  Administered 2019-12-14 – 2019-12-16 (×3): 10 mL

## 2019-12-14 MED ORDER — FENTANYL CITRATE (PF) 100 MCG/2ML IJ SOLN
25.0000 ug | Freq: Once | INTRAMUSCULAR | Status: AC
  Administered 2019-12-14: 25 ug via INTRAVENOUS
  Filled 2019-12-14: qty 2

## 2019-12-14 MED ORDER — ONDANSETRON HCL 4 MG/2ML IJ SOLN
4.0000 mg | Freq: Four times a day (QID) | INTRAMUSCULAR | Status: DC | PRN
  Administered 2019-12-17: 4 mg via INTRAVENOUS
  Filled 2019-12-14: qty 2

## 2019-12-14 MED ORDER — HYDROMORPHONE 1 MG/ML IV SOLN
INTRAVENOUS | Status: DC
  Administered 2019-12-14: 30 mg via INTRAVENOUS
  Administered 2019-12-14: 7 mg via INTRAVENOUS
  Administered 2019-12-14: 14 mg via INTRAVENOUS
  Administered 2019-12-15 (×2): 5 mg via INTRAVENOUS
  Administered 2019-12-15: 2 mg via INTRAVENOUS
  Administered 2019-12-15: 30 mg via INTRAVENOUS
  Filled 2019-12-14 (×2): qty 30

## 2019-12-14 MED ORDER — SODIUM CHLORIDE 0.9% FLUSH: 9.0000 mL | INTRAVENOUS | Status: DC | PRN

## 2019-12-14 MED ORDER — NALOXONE HCL 0.4 MG/ML IJ SOLN: 0.4000 mg | INTRAMUSCULAR | Status: DC | PRN

## 2019-12-14 MED ORDER — SODIUM CHLORIDE 0.9 % IV SOLN
25.0000 mg | INTRAVENOUS | Status: DC | PRN
  Administered 2019-12-17: 25 mg via INTRAVENOUS
  Filled 2019-12-14 (×2): qty 0.5

## 2019-12-14 MED ORDER — DIPHENHYDRAMINE HCL 25 MG PO CAPS
25.0000 mg | ORAL_CAPSULE | ORAL | Status: DC | PRN
  Administered 2019-12-15: 25 mg via ORAL
  Filled 2019-12-14: qty 1

## 2019-12-14 NOTE — Progress Notes (Signed)
Pt states PCA not effective pain still 9/10. Pt states that she normally gets 0.5 loading dose instead of 0.3 PCA. Pt states she is not having her pain well controlled. She also states she normally get 50mg  Benadryl and is asking for more Benadryl after given. She refuses Toradol and kpad. RN educated the importance of using all resources to manage pain. Charge RN also spoken to pt about Dr/PCA setting. Pt still argumentive with staff. On call notified. Ordered Fentanyl 1 time dose and no Benadryl due to multiple doses in ED. Vitals stable. Will continue to monitor.

## 2019-12-14 NOTE — Progress Notes (Signed)
MRI cervical spine with no significant abnormality.   Neurology will sign off. Please call if there are additional questions.   Electronically signed: Dr. Kerney Elbe
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
## 2019-12-14 NOTE — Progress Notes (Signed)
Right lower extremity venous duplex has been completed. Preliminary results can be found in CV Proc through chart review.   12/14/19 11:12 AM Megan [name] RVT
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
## 2019-12-15 DIAGNOSIS — R079 Chest pain, unspecified: Secondary | ICD-10-CM

## 2019-12-15 DIAGNOSIS — S31119A Laceration without foreign body of abdominal wall, unspecified quadrant without penetration into peritoneal cavity, initial encounter: Secondary | ICD-10-CM

## 2019-12-15 DIAGNOSIS — I2510 Atherosclerotic heart disease of native coronary artery without angina pectoris: Secondary | ICD-10-CM

## 2019-12-15 DIAGNOSIS — S31119D Laceration without foreign body of abdominal wall, unspecified quadrant without penetration into peritoneal cavity, subsequent encounter: Secondary | ICD-10-CM

## 2019-12-15 LAB — DIFFERENTIAL
Abs Immature Granulocytes: 0.01 10*3/uL (ref 0.00–0.07)
Basophils Absolute: 0.1 10*3/uL (ref 0.0–0.1)
Basophils Relative: 1 %
Eosinophils Absolute: 0.1 10*3/uL (ref 0.0–0.5)
Eosinophils Relative: 2 %
Immature Granulocytes: 0 %
Lymphocytes Relative: 22 %
Lymphs Abs: 1.3 10*3/uL (ref 0.7–4.0)
Monocytes Absolute: 0.9 10*3/uL (ref 0.1–1.0)
Monocytes Relative: 15 %
Neutro Abs: 3.6 10*3/uL (ref 1.7–7.7)
Neutrophils Relative %: 60 %

## 2019-12-15 LAB — CBC
HCT: 29.3 % — ABNORMAL LOW (ref 36.0–46.0)
Hemoglobin: 8.6 g/dL — ABNORMAL LOW (ref 12.0–15.0)
MCH: 30.9 pg (ref 26.0–34.0)
MCHC: 29.4 g/dL — ABNORMAL LOW (ref 30.0–36.0)
MCV: 105.4 fL — ABNORMAL HIGH (ref 80.0–100.0)
Platelets: 331 10*3/uL (ref 150–400)
RBC: 2.78 MIL/uL — ABNORMAL LOW (ref 3.87–5.11)
RDW: 20 % — ABNORMAL HIGH (ref 11.5–15.5)
WBC: 5.6 10*3/uL (ref 4.0–10.5)
nRBC: 0.5 % — ABNORMAL HIGH (ref 0.0–0.2)

## 2019-12-15 LAB — IRON AND TIBC
Iron: 33 ug/dL (ref 28–170)
Saturation Ratios: 10 % — ABNORMAL LOW (ref 10.4–31.8)
TIBC: 333 ug/dL (ref 250–450)
UIBC: 300 ug/dL

## 2019-12-15 LAB — VITAMIN B12: Vitamin B-12: 303 pg/mL (ref 180–914)

## 2019-12-15 LAB — FERRITIN: Ferritin: 25 ng/mL (ref 11–307)

## 2019-12-15 LAB — HIV ANTIBODY (ROUTINE TESTING W REFLEX): HIV Screen 4th Generation wRfx: NONREACTIVE

## 2019-12-15 MED ORDER — MORPHINE SULFATE ER 30 MG PO TBCR
30.0000 mg | EXTENDED_RELEASE_TABLET | Freq: Two times a day (BID) | ORAL | Status: DC
  Administered 2019-12-15 – 2019-12-16 (×4): 30 mg via ORAL
  Filled 2019-12-15 (×5): qty 1

## 2019-12-15 MED ORDER — OXYCODONE HCL 5 MG PO TABS: 10.0000 mg | ORAL_TABLET | ORAL | Status: DC | PRN

## 2019-12-15 MED ORDER — HYDROMORPHONE 1 MG/ML IV SOLN
INTRAVENOUS | Status: DC
  Administered 2019-12-15: 7 mg via INTRAVENOUS
  Administered 2019-12-15: 30 mg via INTRAVENOUS
  Administered 2019-12-16: 5.4 mg via INTRAVENOUS
  Filled 2019-12-15: qty 30

## 2019-12-15 MED ORDER — CLOPIDOGREL BISULFATE 75 MG PO TABS
75.0000 mg | ORAL_TABLET | Freq: Every day | ORAL | Status: DC
  Administered 2019-12-15 – 2019-12-16 (×2): 75 mg via ORAL
  Filled 2019-12-15 (×3): qty 1
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
## 2019-12-16 ENCOUNTER — Encounter: Payer: Self-pay | Admitting: Family Medicine

## 2019-12-16 LAB — CBC
HCT: 29.6 % — ABNORMAL LOW (ref 36.0–46.0)
Hemoglobin: 9 g/dL — ABNORMAL LOW (ref 12.0–15.0)
MCH: 31.8 pg (ref 26.0–34.0)
MCHC: 30.4 g/dL (ref 30.0–36.0)
MCV: 104.6 fL — ABNORMAL HIGH (ref 80.0–100.0)
Platelets: 292 10*3/uL (ref 150–400)
RBC: 2.83 MIL/uL — ABNORMAL LOW (ref 3.87–5.11)
RDW: 20 % — ABNORMAL HIGH (ref 11.5–15.5)
WBC: 5.6 10*3/uL (ref 4.0–10.5)
nRBC: 0.4 % — ABNORMAL HIGH (ref 0.0–0.2)

## 2019-12-16 LAB — ANA: Anti Nuclear Antibody (ANA): NEGATIVE

## 2019-12-16 MED ORDER — HYDROMORPHONE 1 MG/ML IV SOLN
INTRAVENOUS | Status: DC
  Administered 2019-12-16: 4 mg via INTRAVENOUS
  Administered 2019-12-16: 1.5 mg via INTRAVENOUS
  Administered 2019-12-16: 6.5 mg via INTRAVENOUS
  Administered 2019-12-17: 30 mg via INTRAVENOUS
  Administered 2019-12-17: 5 mg via INTRAVENOUS
  Administered 2019-12-17: 2 mg via INTRAVENOUS
  Filled 2019-12-16: qty 30

## 2019-12-16 MED ORDER — ENOXAPARIN SODIUM 80 MG/0.8ML ~~LOC~~ SOLN
80.0000 mg | SUBCUTANEOUS | Status: DC
  Administered 2019-12-16: 80 mg via SUBCUTANEOUS
  Filled 2019-12-16: qty 0.8
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
## 2019-12-16 NOTE — Progress Notes (Signed)
Brief Pharmacy Anticoagulation Note:  Pharmacy consulted to dose enoxaparin for VTE prophylaxis  Enoxaparin 0.5mg /kg (80mg ) SQ q24h Follow renal function and ability to resume home warfarin  Dolly Rias RPh 12/16/2019, 4:57 PM

## 2019-12-16 NOTE — Progress Notes (Addendum)
Noted patient has periods of confusion, alert, sleepy but arousable, has ETCo2 of 51mmhg; pt was encouraged to use incentive spirometer and placed on O2 ; VSS; provider on call was paged d/t concern of ETCO2 and confusion; pt's ETCO2 has been mostly the same  for the past days as shown in flowsheet. Provider will review pts file; will continue to monitor pt.

## 2019-12-17 LAB — HGB FRACTIONATION CASCADE
Hgb A2: 2.4 % (ref 1.8–3.2)
Hgb A: 96.9 % (ref 96.4–98.8)
Hgb F: 0.7 % (ref 0.0–2.0)
Hgb S: 0 %

## 2019-12-17 LAB — CBC WITH DIFFERENTIAL/PLATELET
Abs Immature Granulocytes: 0.01 10*3/uL (ref 0.00–0.07)
Basophils Absolute: 0 10*3/uL (ref 0.0–0.1)
Basophils Relative: 1 %
Eosinophils Absolute: 0.1 10*3/uL (ref 0.0–0.5)
Eosinophils Relative: 2 %
HCT: 29.6 % — ABNORMAL LOW (ref 36.0–46.0)
Hemoglobin: 8.8 g/dL — ABNORMAL LOW (ref 12.0–15.0)
Immature Granulocytes: 0 %
Lymphocytes Relative: 24 %
Lymphs Abs: 1.2 10*3/uL (ref 0.7–4.0)
MCH: 31 pg (ref 26.0–34.0)
MCHC: 29.7 g/dL — ABNORMAL LOW (ref 30.0–36.0)
MCV: 104.2 fL — ABNORMAL HIGH (ref 80.0–100.0)
Monocytes Absolute: 0.9 10*3/uL (ref 0.1–1.0)
Monocytes Relative: 18 %
Neutro Abs: 2.7 10*3/uL (ref 1.7–7.7)
Neutrophils Relative %: 55 %
Platelets: 298 10*3/uL (ref 150–400)
RBC: 2.84 MIL/uL — ABNORMAL LOW (ref 3.87–5.11)
RDW: 20.1 % — ABNORMAL HIGH (ref 11.5–15.5)
WBC: 4.8 10*3/uL (ref 4.0–10.5)
nRBC: 0.4 % — ABNORMAL HIGH (ref 0.0–0.2)

## 2019-12-17 LAB — RETICULOCYTES
Immature Retic Fract: 14.5 % (ref 2.3–15.9)
RBC.: 2.79 MIL/uL — ABNORMAL LOW (ref 3.87–5.11)
Retic Count, Absolute: 77 10*3/uL (ref 19.0–186.0)
Retic Ct Pct: 2.7 % (ref 0.4–3.1)

## 2019-12-17 NOTE — TOC Transition Note (Signed)
Transition of Care [facility]) - CM/SW Discharge Note   Patient Details  Name: Megan [name] MRN: [id number] Date of Birth: 28-Jul-1986  Transition of Care [facility]) CM/SW Contact:  Trish Mage, LCSW Phone Number: 12/17/2019, 1:04 PM   Clinical Narrative:   D/C orders in place.  Received call from unit stating she needed bus pass to go home.  Brought bus pass, asked patient where she was going.  She identified [place].  I asked her the plan.  She is planning on taking the bus to [place] where she can get money wired to catch a ride. Did not know which [employer] she was going to.  NT offered to keep her here while we worked out details with her.  She declined.  She was willing to accept a list of local [employer] with addresses so she could at least ask for help at depot as to which bus to get on to get to destination.  Made sure she know where bus stop is. TOC sign off.    Final next level of care: Home/Self Care Barriers to Discharge: No Barriers Identified   Patient Goals and CMS Choice        Discharge Placement                       Discharge Plan and Services In-house Referral: Clinical Social Work                                   Social Determinants of Health (SDOH) Interventions     Readmission Risk Interventions No flowsheet data found.

## 2019-12-17 NOTE — Progress Notes (Signed)
Pt discharged with bus pass to go to [place] to get money wired to then catch a ride to [place]. Pt pulled out her Right chest port and stated "I do not need help removing this, I access and remove my port all the time." Discharge instructions and information provided to pt.

## 2019-12-17 NOTE — Discharge Summary (Signed)
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 4.8  NEUTROABS 4.4  --  3.6  --  2.7  HGB 8.8* 10.2* 8.6* 9.0* 8.8*  HCT 29.5* 30.0* 29.3* 29.6* 29.6*  MCV 102.4*  --  105.4* 104.6* 104.2*  PLT 354  --  331 292 298   Cardiac Enzymes: No results for input(s): CKTOTAL, CKMB, CKMBINDEX, TROPONINI in the last 168 hours. BNP: Invalid input(s): POCBNP CBG: No results for input(s): GLUCAP in the last 168 hours.  Time coordinating discharge: 50 minutes  Signed:   Donia Pounds  APRN, MSN, FNP-C [facility] [address] [place], [place] [address] [phone]  [facility] 12/17/2019, 1:18 PM

## 2019-12-20 LAB — HGB FRACTIONATION CASCADE
Hgb A2: 2.3 % (ref 1.8–3.2)
Hgb A: 96.8 % (ref 96.4–98.8)
Hgb F: 0.9 % (ref 0.0–2.0)
Hgb S: 0 %

## 2019-12-24 ENCOUNTER — Ambulatory Visit: Payer: Self-pay | Admitting: Family Medicine
